# Patient Record
Sex: Female | Born: 1965 | Race: White | Hispanic: No | Marital: Married | Smoking: Former smoker
Health system: Southern US, Community
[De-identification: ages and names within clinical notes are randomized; demographics above are authoritative.]

## PROBLEM LIST (undated history)

## (undated) DIAGNOSIS — T7840XA Allergy, unspecified, initial encounter: Secondary | ICD-10-CM

## (undated) DIAGNOSIS — S92902A Unspecified fracture of left foot, initial encounter for closed fracture: Secondary | ICD-10-CM

## (undated) DIAGNOSIS — K219 Gastro-esophageal reflux disease without esophagitis: Secondary | ICD-10-CM

## (undated) HISTORY — PX: FOOT SURGERY: SHX648

## (undated) HISTORY — PX: ABDOMINAL HYSTERECTOMY: SHX81

## (undated) HISTORY — PX: TUBAL LIGATION: SHX77

## (undated) HISTORY — DX: Allergy, unspecified, initial encounter: T78.40XA

## (undated) HISTORY — PX: ELBOW FRACTURE SURGERY: SHX616

---

## 1998-01-22 ENCOUNTER — Other Ambulatory Visit: Admission: RE | Admit: 1998-01-22 | Discharge: 1998-01-22 | Payer: Self-pay | Admitting: Obstetrics & Gynecology

## 1998-04-28 ENCOUNTER — Ambulatory Visit (HOSPITAL_COMMUNITY): Admission: RE | Admit: 1998-04-28 | Discharge: 1998-04-28 | Payer: Self-pay | Admitting: Obstetrics and Gynecology

## 1998-07-29 ENCOUNTER — Inpatient Hospital Stay (HOSPITAL_COMMUNITY): Admission: AD | Admit: 1998-07-29 | Discharge: 1998-07-31 | Payer: Self-pay | Admitting: Obstetrics & Gynecology

## 1998-09-09 ENCOUNTER — Ambulatory Visit (HOSPITAL_COMMUNITY): Admission: RE | Admit: 1998-09-09 | Discharge: 1998-09-09 | Payer: Self-pay | Admitting: Obstetrics and Gynecology

## 2015-04-01 ENCOUNTER — Encounter (HOSPITAL_BASED_OUTPATIENT_CLINIC_OR_DEPARTMENT_OTHER): Payer: Self-pay | Admitting: *Deleted

## 2015-04-02 ENCOUNTER — Ambulatory Visit (HOSPITAL_BASED_OUTPATIENT_CLINIC_OR_DEPARTMENT_OTHER): Payer: Commercial Managed Care - PPO | Admitting: Anesthesiology

## 2015-04-02 ENCOUNTER — Encounter (HOSPITAL_BASED_OUTPATIENT_CLINIC_OR_DEPARTMENT_OTHER)
Admission: RE | Disposition: A | Discharge status: Home / Self Care | Payer: Self-pay | Source: Ambulatory Visit | Attending: Orthopedic Surgery

## 2015-04-02 ENCOUNTER — Ambulatory Visit (HOSPITAL_BASED_OUTPATIENT_CLINIC_OR_DEPARTMENT_OTHER)
Admission: RE | Admit: 2015-04-02 | Discharge: 2015-04-02 | Disposition: A | Discharge status: Home / Self Care | Payer: Commercial Managed Care - PPO | Source: Ambulatory Visit | Attending: Orthopedic Surgery | Admitting: Orthopedic Surgery

## 2015-04-02 ENCOUNTER — Encounter (HOSPITAL_BASED_OUTPATIENT_CLINIC_OR_DEPARTMENT_OTHER): Payer: Self-pay | Admitting: *Deleted

## 2015-04-02 DIAGNOSIS — Z79899 Other long term (current) drug therapy: Secondary | ICD-10-CM | POA: Diagnosis not present

## 2015-04-02 DIAGNOSIS — Y929 Unspecified place or not applicable: Secondary | ICD-10-CM | POA: Diagnosis not present

## 2015-04-02 DIAGNOSIS — Z87891 Personal history of nicotine dependence: Secondary | ICD-10-CM | POA: Insufficient documentation

## 2015-04-02 DIAGNOSIS — Y999 Unspecified external cause status: Secondary | ICD-10-CM | POA: Insufficient documentation

## 2015-04-02 DIAGNOSIS — Y939 Activity, unspecified: Secondary | ICD-10-CM | POA: Diagnosis not present

## 2015-04-02 DIAGNOSIS — M79672 Pain in left foot: Secondary | ICD-10-CM | POA: Diagnosis present

## 2015-04-02 DIAGNOSIS — Z9889 Other specified postprocedural states: Secondary | ICD-10-CM | POA: Diagnosis not present

## 2015-04-02 DIAGNOSIS — S92352A Displaced fracture of fifth metatarsal bone, left foot, initial encounter for closed fracture: Secondary | ICD-10-CM | POA: Insufficient documentation

## 2015-04-02 DIAGNOSIS — Z90710 Acquired absence of both cervix and uterus: Secondary | ICD-10-CM | POA: Insufficient documentation

## 2015-04-02 DIAGNOSIS — K219 Gastro-esophageal reflux disease without esophagitis: Secondary | ICD-10-CM | POA: Diagnosis not present

## 2015-04-02 DIAGNOSIS — X501XXA Overexertion from prolonged static or awkward postures, initial encounter: Secondary | ICD-10-CM | POA: Insufficient documentation

## 2015-04-02 HISTORY — DX: Unspecified fracture of left foot, initial encounter for closed fracture: S92.902A

## 2015-04-02 HISTORY — DX: Gastro-esophageal reflux disease without esophagitis: K21.9

## 2015-04-02 HISTORY — PX: ORIF TOE FRACTURE: SHX5032

## 2015-04-02 SURGERY — OPEN REDUCTION INTERNAL FIXATION (ORIF) METATARSAL (TOE) FRACTURE
Anesthesia: Regional | Site: Toe | Laterality: Left

## 2015-04-02 MED ORDER — LACTATED RINGERS IV SOLN
INTRAVENOUS | Status: DC
  Administered 2015-04-02 (×2): via INTRAVENOUS

## 2015-04-02 MED ORDER — BUPIVACAINE-EPINEPHRINE (PF) 0.5% -1:200000 IJ SOLN
INTRAMUSCULAR | Status: DC | PRN
  Administered 2015-04-02: 30 mL via PERINEURAL

## 2015-04-02 MED ORDER — GLYCOPYRROLATE 0.2 MG/ML IJ SOLN
0.2000 mg | Freq: Once | INTRAMUSCULAR | Status: DC | PRN
Start: 2015-04-02 — End: 2015-04-02

## 2015-04-02 MED ORDER — MIDAZOLAM HCL 2 MG/2ML IJ SOLN
1.0000 mg | INTRAMUSCULAR | Status: DC | PRN
  Administered 2015-04-02: 2 mg via INTRAVENOUS

## 2015-04-02 MED ORDER — LIDOCAINE HCL (CARDIAC) 20 MG/ML IV SOLN
INTRAVENOUS | Status: AC
  Filled 2015-04-02: qty 5

## 2015-04-02 MED ORDER — HYDROMORPHONE HCL 1 MG/ML IJ SOLN
INTRAMUSCULAR | Status: AC
  Filled 2015-04-02: qty 1

## 2015-04-02 MED ORDER — ONDANSETRON HCL 4 MG/2ML IJ SOLN
INTRAMUSCULAR | Status: DC | PRN
  Administered 2015-04-02: 4 mg via INTRAVENOUS

## 2015-04-02 MED ORDER — FENTANYL CITRATE (PF) 100 MCG/2ML IJ SOLN
INTRAMUSCULAR | Status: AC
  Filled 2015-04-02: qty 2

## 2015-04-02 MED ORDER — HYDROMORPHONE HCL 1 MG/ML IJ SOLN
0.2500 mg | INTRAMUSCULAR | Status: DC | PRN
  Administered 2015-04-02 (×3): 0.5 mg via INTRAVENOUS

## 2015-04-02 MED ORDER — FENTANYL CITRATE (PF) 100 MCG/2ML IJ SOLN
INTRAMUSCULAR | Status: AC
  Filled 2015-04-02: qty 4

## 2015-04-02 MED ORDER — MIDAZOLAM HCL 2 MG/2ML IJ SOLN
INTRAMUSCULAR | Status: AC
  Filled 2015-04-02: qty 2

## 2015-04-02 MED ORDER — PROPOFOL 10 MG/ML IV BOLUS
INTRAVENOUS | Status: DC | PRN
  Administered 2015-04-02: 200 mg via INTRAVENOUS

## 2015-04-02 MED ORDER — CEFAZOLIN SODIUM-DEXTROSE 2-3 GM-% IV SOLR
INTRAVENOUS | Status: AC
  Filled 2015-04-02: qty 50

## 2015-04-02 MED ORDER — VITAMIN D (ERGOCALCIFEROL) 1.25 MG (50000 UNIT) PO CAPS: 50000.0000 [IU] | ORAL_CAPSULE | ORAL | Status: DC

## 2015-04-02 MED ORDER — ONDANSETRON HCL 4 MG/2ML IJ SOLN
INTRAMUSCULAR | Status: AC
  Filled 2015-04-02: qty 2

## 2015-04-02 MED ORDER — DEXAMETHASONE SODIUM PHOSPHATE 10 MG/ML IJ SOLN
INTRAMUSCULAR | Status: DC | PRN
  Administered 2015-04-02: 10 mg via INTRAVENOUS

## 2015-04-02 MED ORDER — OXYCODONE HCL 5 MG PO TABS
ORAL_TABLET | ORAL | Status: AC
  Filled 2015-04-02: qty 1

## 2015-04-02 MED ORDER — OXYCODONE HCL 5 MG/5ML PO SOLN: 5.0000 mg | Freq: Once | ORAL | Status: AC | PRN

## 2015-04-02 MED ORDER — FENTANYL CITRATE (PF) 100 MCG/2ML IJ SOLN
50.0000 ug | INTRAMUSCULAR | Status: AC | PRN
  Administered 2015-04-02: 100 ug via INTRAVENOUS
  Administered 2015-04-02 (×2): 50 ug via INTRAVENOUS

## 2015-04-02 MED ORDER — OXYCODONE HCL 5 MG PO TABS
5.0000 mg | ORAL_TABLET | Freq: Once | ORAL | Status: AC | PRN
  Administered 2015-04-02: 5 mg via ORAL

## 2015-04-02 MED ORDER — CEFAZOLIN SODIUM-DEXTROSE 2-3 GM-% IV SOLR
2.0000 g | INTRAVENOUS | Status: AC
  Administered 2015-04-02: 2 g via INTRAVENOUS

## 2015-04-02 MED ORDER — CHLORHEXIDINE GLUCONATE 4 % EX LIQD: 60.0000 mL | Freq: Once | CUTANEOUS | Status: DC

## 2015-04-02 MED ORDER — MEPERIDINE HCL 25 MG/ML IJ SOLN: 6.2500 mg | INTRAMUSCULAR | Status: DC | PRN

## 2015-04-02 MED ORDER — DEXAMETHASONE SODIUM PHOSPHATE 10 MG/ML IJ SOLN
INTRAMUSCULAR | Status: AC
  Filled 2015-04-02: qty 1

## 2015-04-02 MED ORDER — OXYCODONE HCL 5 MG PO TABS: ORAL_TABLET | ORAL | Status: DC

## 2015-04-02 MED ORDER — LIDOCAINE HCL (CARDIAC) 20 MG/ML IV SOLN
INTRAVENOUS | Status: DC | PRN
  Administered 2015-04-02: 50 mg via INTRAVENOUS

## 2015-04-02 MED ORDER — SCOPOLAMINE 1 MG/3DAYS TD PT72: 1.0000 | MEDICATED_PATCH | Freq: Once | TRANSDERMAL | Status: DC | PRN

## 2015-04-02 MED ORDER — DIAZEPAM 2 MG PO TABS: 2.0000 mg | ORAL_TABLET | Freq: Three times a day (TID) | ORAL | Status: DC | PRN

## 2015-04-02 SURGICAL SUPPLY — 41 items
BANDAGE ELASTIC 4 VELCRO ST LF (GAUZE/BANDAGES/DRESSINGS) ×3 IMPLANT
BANDAGE ELASTIC 6 VELCRO ST LF (GAUZE/BANDAGES/DRESSINGS) ×3 IMPLANT
BLADE SURG 15 STRL LF DISP TIS (BLADE) ×1 IMPLANT
BLADE SURG 15 STRL SS (BLADE) ×2
BNDG COHESIVE 3X5 TAN STRL LF (GAUZE/BANDAGES/DRESSINGS) ×3 IMPLANT
COVER BACK TABLE 60X90IN (DRAPES) ×3 IMPLANT
DRAPE EXTREMITY T 121X128X90 (DRAPE) ×3 IMPLANT
DRAPE OEC MINIVIEW 54X84 (DRAPES) ×3 IMPLANT
DRAPE U 20/CS (DRAPES) ×3 IMPLANT
DRAPE U-SHAPE 47X51 STRL (DRAPES) ×3 IMPLANT
DRILL BIT CANNULATED 3.2 (BIT) ×3 IMPLANT
DURAPREP 26ML APPLICATOR (WOUND CARE) ×3 IMPLANT
ELECT REM PT RETURN 9FT ADLT (ELECTROSURGICAL) ×3
ELECTRODE REM PT RTRN 9FT ADLT (ELECTROSURGICAL) ×1 IMPLANT
GAUZE SPONGE 4X4 12PLY STRL (GAUZE/BANDAGES/DRESSINGS) ×3 IMPLANT
GAUZE XEROFORM 1X8 LF (GAUZE/BANDAGES/DRESSINGS) ×3 IMPLANT
GLOVE BIO SURGEON STRL SZ7 (GLOVE) ×3 IMPLANT
GLOVE BIOGEL PI IND STRL 7.0 (GLOVE) ×3 IMPLANT
GLOVE BIOGEL PI IND STRL 7.5 (GLOVE) ×1 IMPLANT
GLOVE BIOGEL PI INDICATOR 7.0 (GLOVE) ×6
GLOVE BIOGEL PI INDICATOR 7.5 (GLOVE) ×2
GLOVE ECLIPSE 6.5 STRL STRAW (GLOVE) ×3 IMPLANT
GLOVE SS BIOGEL STRL SZ 7.5 (GLOVE) ×1 IMPLANT
GLOVE SUPERSENSE BIOGEL SZ 7.5 (GLOVE) ×2
GOWN STRL REUS W/ TWL LRG LVL3 (GOWN DISPOSABLE) ×3 IMPLANT
GOWN STRL REUS W/TWL LRG LVL3 (GOWN DISPOSABLE) ×6
K-WIRE 2.0 (WIRE) ×2
K-WIRE FX228X2XKRSH (WIRE) ×1
KWIRE FX228X2XKRSH (WIRE) ×1 IMPLANT
NS IRRIG 1000ML POUR BTL (IV SOLUTION) ×3 IMPLANT
PACK BASIN DAY SURGERY FS (CUSTOM PROCEDURE TRAY) ×3 IMPLANT
PADDING CAST ABS 4INX4YD NS (CAST SUPPLIES) ×4
PADDING CAST ABS COTTON 4X4 ST (CAST SUPPLIES) ×2 IMPLANT
PADDING CAST COTTON 6X4 STRL (CAST SUPPLIES) ×6 IMPLANT
SCREW JONES 4.5X45 (Screw) ×3 IMPLANT
SPLINT FAST PLASTER 5X30 (CAST SUPPLIES) ×40
SPLINT PLASTER CAST FAST 5X30 (CAST SUPPLIES) ×20 IMPLANT
SUT ETHILON 3 0 PS 1 (SUTURE) ×3 IMPLANT
SYR BULB 3OZ (MISCELLANEOUS) ×3 IMPLANT
TOWEL OR 17X24 6PK STRL BLUE (TOWEL DISPOSABLE) ×6 IMPLANT
UNDERPAD 30X30 (UNDERPADS AND DIAPERS) ×3 IMPLANT

## 2015-04-02 NOTE — Progress Notes (Signed)
Assisted Dr. Crews with left, ultrasound guided, popliteal block. Side rails up, monitors on throughout procedure. See vital signs in flow sheet. Tolerated Procedure well. 

## 2015-04-02 NOTE — Anesthesia Preprocedure Evaluation (Signed)
Anesthesia Evaluation  Patient identified by MRN, date of birth, ID band Patient awake    Reviewed: Allergy & Precautions, NPO status , Patient's Chart, lab work & pertinent test results  Airway Mallampati: I  TM Distance: >3 FB Neck ROM: Full    Dental  (+) Teeth Intact, Dental Advisory Given   Pulmonary former smoker,    breath sounds clear to auscultation       Cardiovascular  Rhythm:Regular Rate:Normal     Neuro/Psych    GI/Hepatic GERD  Medicated and Controlled,  Endo/Other    Renal/GU      Musculoskeletal   Abdominal   Peds  Hematology   Anesthesia Other Findings   Reproductive/Obstetrics                             Anesthesia Physical Anesthesia Plan  ASA: II  Anesthesia Plan: General and Regional   Post-op Pain Management:    Induction: Intravenous  Airway Management Planned: LMA  Additional Equipment:   Intra-op Plan:   Post-operative Plan: Extubation in OR  Informed Consent: I have reviewed the patients History and Physical, chart, labs and discussed the procedure including the risks, benefits and alternatives for the proposed anesthesia with the patient or authorized representative who has indicated his/her understanding and acceptance.   Dental advisory given  Plan Discussed with: CRNA, Anesthesiologist and Surgeon  Anesthesia Plan Comments:         Anesthesia Quick Evaluation  

## 2015-04-02 NOTE — Anesthesia Procedure Notes (Addendum)
Anesthesia Regional Block:  Popliteal block  Pre-Anesthetic Checklist: ,, timeout performed, Correct Patient, Correct Site, Correct Laterality, Correct Procedure, Correct Position, site marked, Risks and benefits discussed,  Surgical consent,  Pre-op evaluation,  At surgeon's request and post-op pain management  Laterality: Left and Lower  Prep: chloraprep       Needles:  Injection technique: Single-shot  Needle Type: Echogenic Needle     Needle Length: 9cm 9 cm Needle Gauge: 21 and 21 G    Additional Needles:  Procedures: ultrasound guided (picture in chart) Popliteal block Narrative:  Start time: 04/02/2015 10:07 AM End time: 04/02/2015 10:12 AM Injection made incrementally with aspirations every 5 mL.  Performed by: Personally  Anesthesiologist: CREWS, DAVID   Procedure Name: LMA Insertion Date/Time: 04/02/2015 11:07 AM Performed by: Caren MacadamARTER, Jalyn Rosero W Pre-anesthesia Checklist: Patient identified, Emergency Drugs available, Suction available and Patient being monitored Patient Re-evaluated:Patient Re-evaluated prior to inductionOxygen Delivery Method: Circle System Utilized Preoxygenation: Pre-oxygenation with 100% oxygen Intubation Type: IV induction Ventilation: Mask ventilation without difficulty LMA: LMA inserted LMA Size: 4.0 Number of attempts: 1 Airway Equipment and Method: Bite block Placement Confirmation: positive ETCO2 and breath sounds checked- equal and bilateral Tube secured with: Tape Dental Injury: Teeth and Oropharynx as per pre-operative assessment

## 2015-04-02 NOTE — Transfer of Care (Signed)
Immediate Anesthesia Transfer of Care Note  Patient: Jennifer Gilbert  Procedure(s) Performed: Procedure(s): OPEN REDUCTION INTERNAL FIXATION (ORIF) LEFT FOOT FIFTH METATARSAL (TOE) FRACTURE (Left)  Patient Location: PACU  Anesthesia Type:General and GA combined with regional for post-op pain  Level of Consciousness: awake  Airway & Oxygen Therapy: Patient Spontanous Breathing and Patient connected to face mask oxygen  Post-op Assessment: Report given to RN and Post -op Vital signs reviewed and stable  Post vital signs: Reviewed and stable  Last Vitals:  Filed Vitals:   04/02/15 1030  BP:   Pulse: 88  Temp:   Resp: 23    Complications: No apparent anesthesia complications

## 2015-04-02 NOTE — Anesthesia Postprocedure Evaluation (Signed)
  Anesthesia Post-op Note  Patient: Jennifer Gilbert  Procedure(s) Performed: Procedure(s): OPEN REDUCTION INTERNAL FIXATION (ORIF) LEFT FOOT FIFTH METATARSAL (TOE) FRACTURE (Left)  Patient Location: PACU  Anesthesia Type: General, Regional   Level of Consciousness: awake, alert  and oriented  Airway and Oxygen Therapy: Patient Spontanous Breathing  Post-op Pain: mild  Post-op Assessment: Post-op Vital signs reviewed  Post-op Vital Signs: Reviewed  Last Vitals:  Filed Vitals:   04/02/15 1200  BP: 111/67  Pulse: 85  Temp:   Resp: 12    Complications: No apparent anesthesia complications

## 2015-04-02 NOTE — H&P (Signed)
Jennifer Gilbert is an 49 y.o. female.   Chief Complaint: left foot pain HPI: Ms. Jennifer Gilbert is a 49 year-old seen for acute injury to her left foot that occurred with a twisting injury two days ago.  Seen at [facility] where x-rays were consistent with a Jones fracture of her left fifth metatarsal.  She has a history of a Jones fracture on her right foot that required surgery, which was four years ago.  She was placed in a CAM walker when seen at Urgent Care two days ago.    Past Medical History  Diagnosis Date  . GERD (gastroesophageal reflux disease)   . Foot fracture, left     Past Surgical History  Procedure Laterality Date  . Foot surgery Right   . Abdominal hysterectomy    . Cesarean section    . Elbow fracture surgery Left     History reviewed. No pertinent family history. Social History:  reports that she has quit smoking. She does not have any smokeless tobacco history on file. She reports that she drinks alcohol. She reports that she does not use illicit drugs.  Allergies: No Known Allergies  Medications Prior to Admission  Medication Sig Dispense Refill  . omeprazole (PRILOSEC) 20 MG capsule Take 20 mg by mouth daily.      No results found for this or any previous visit (from the past 48 hour(s)). No results found.  Review of Systems  Constitutional: Negative.   HENT: Negative.   Eyes: Negative.   Respiratory: Negative.   Cardiovascular: Negative.   Gastrointestinal: Negative.   Genitourinary: Negative.   Musculoskeletal: Positive for joint pain.       Foot pain  Skin: Negative.   Neurological: Negative.   Endo/Heme/Allergies: Negative.   Psychiatric/Behavioral: Negative.     Blood pressure 117/96, pulse 83, temperature 98.2 F (36.8 C), temperature source Oral, resp. rate 20, height 5\' 9"  (1.753 m), weight 117.482 kg (259 lb), SpO2 100 %. Physical Exam  Constitutional: She is oriented to person, place, and time. She appears well-developed and  well-nourished.  HENT:  Head: Normocephalic and atraumatic.  Mouth/Throat: Oropharynx is clear and moist.  Eyes: Conjunctivae are normal. Pupils are equal, round, and reactive to light.  Neck: Neck supple.  Cardiovascular: Normal rate and regular rhythm.   Respiratory: Effort normal and breath sounds normal.  GI: Bowel sounds are normal.  Genitourinary:  Not pertinent to current symptomatology therefore not examined.  Musculoskeletal:  Examination of her left foot reveals significant swelling and pain.  No deformity.  No wounds.  Examination of her right foot reveals previous well healed lateral incision with swelling, deformity, and  pain.  Vascular exam: Pulses are 2+ and symmetric.   Neurological: She is alert and oriented to person, place, and time.  Skin: Skin is dry.  Psychiatric: She has a normal mood and affect. Her behavior is normal.     Assessment 1. Left foot acute traumatic fifth metatarsal Jones fracture.   2. History of previous right foot ORIF for Jones fracture. Plan At this point I have recommended that we proceed with ORIF of this.  Risks, complications and benefits of the surgery have been described to her in detail and she understands this completely.  We will plan on setting her up for this at some point in the near future.   Jaggar Benko J 04/02/2015, 9:37 AM

## 2015-04-02 NOTE — Discharge Instructions (Signed)
° ° °  Regional Anesthesia Blocks ° °1. Numbness or the inability to move the "blocked" extremity may last from 3-48 hours after placement. The length of time depends on the medication injected and your individual response to the medication. If the numbness is not going away after 48 hours, call your surgeon. ° °2. The extremity that is blocked will need to be protected until the numbness is gone and the  Strength has returned. Because you cannot feel it, you will need to take extra care to avoid injury. Because it may be weak, you may have difficulty moving it or using it. You may not know what position it is in without looking at it while the block is in effect. ° °3. For blocks in the legs and feet, returning to weight bearing and walking needs to be done carefully. You will need to wait until the numbness is entirely gone and the strength has returned. You should be able to move your leg and foot normally before you try and bear weight or walk. You will need someone to be with you when you first try to ensure you do not fall and possibly risk injury. ° °4. Bruising and tenderness at the needle site are common side effects and will resolve in a few days. ° °5. Persistent numbness or new problems with movement should be communicated to the surgeon or the [facility] ([phone])/ [facility] ([phone]). ° ° ° °Post Anesthesia Home Care Instructions ° °Activity: °Get plenty of rest for the remainder of the day. A responsible adult should stay with you for 24 hours following the procedure.  °For the next 24 hours, DO NOT: °-Drive a car °-Operate machinery °-Drink alcoholic beverages °-Take any medication unless instructed by your physician °-Make any legal decisions or sign important papers. ° °Meals: °Start with liquid foods such as gelatin or soup. Progress to regular foods as tolerated. Avoid greasy, spicy, heavy foods. If nausea and/or vomiting occur, drink only clear liquids until  the nausea and/or vomiting subsides. Call your physician if vomiting continues. ° °Special Instructions/Symptoms: °Your throat may feel dry or sore from the anesthesia or the breathing tube placed in your throat during surgery. If this causes discomfort, gargle with warm salt water. The discomfort should disappear within 24 hours. ° °If you had a scopolamine patch placed behind your ear for the management of post- operative nausea and/or vomiting: ° °1. The medication in the patch is effective for 72 hours, after which it should be removed.  Wrap patch in a tissue and discard in the trash. Wash hands thoroughly with soap and water. °2. You may remove the patch earlier than 72 hours if you experience unpleasant side effects which may include dry mouth, dizziness or visual disturbances. °3. Avoid touching the patch. Wash your hands with soap and water after contact with the patch. °  ° °

## 2015-04-03 ENCOUNTER — Encounter (HOSPITAL_BASED_OUTPATIENT_CLINIC_OR_DEPARTMENT_OTHER): Payer: Self-pay | Admitting: Orthopedic Surgery

## 2015-04-04 NOTE — Op Note (Signed)
NAMCristino Martes:  Louischarles, Jonisha               ACCOUNT NO.:  [id number]  MEDICAL RECORD NO.:  [id number]  LOCATION:                                 FACILITY:  PHYSICIAN:  Jadae Steinke A. Thurston HoleWainer, M.D.      DATE OF BIRTH:  DATE OF PROCEDURE:  04/02/2015 DATE OF DISCHARGE:                              OPERATIVE REPORT   PREOPERATIVE DIAGNOSIS:  Left foot acute traumatic fifth metatarsal displaced Jones fracture.  POSTOPERATIVE DIAGNOSIS:  Left foot acute traumatic fifth metatarsal displaced Jones fracture.  PROCEDURE:  Open reduction and internal fixation of left foot fifth metatarsal Jones fracture.  SURGEON:  Elana Almobert A. Thurston HoleWainer, M.D.  ASSISTANT:  Kirstin Shepperson, PA-C.  ANESTHESIA:  General.  OPERATIVE TIME:  45 minutes.  COMPLICATIONS:  None.  INDICATIONS FOR PROCEDURE:  [name] is a 49 year old woman who had sustained a twisting injury to her left foot 2 weeks ago.  Exam and x- rays have revealed a displaced fifth metatarsal Jones fracture and she is now to undergo ORIF of this.  DESCRIPTION OF PROCEDURE:  [name] was brought into the operating room on April 02, 2015, placed on the operating table in supine position.  After being placed under general anesthesia, her left foot and leg were prepped using sterile DuraPrep and draped using sterile technique.  Time-out procedure was called and the correct left foot identified.  The left foot after being sterilely prepped and draped had a small 1 cm incision made proximal to the base of the fifth metatarsal under fluoroscopic control.  A guide pin was placed at the tip of the fifth metatarsal and then drilled down the metatarsal shaft under fluoroscopic control.  After this was done, it was then overdrilled with a 3.2 mm drill and then measured for length and then a 4.5 x 45 mm [facility] fifth metatarsal screw was used and placed across the fracture site with firm fixation in anatomic position.  AP and lateral fluoroscopic  x-rays confirmed this.  Neurovascular structures carefully protected during the procedure.  After this was done, it was felt that all pathology had been satisfactorily addressed.  The wound was irrigated and then closed with interrupted 3-0 nylon suture.  Sterile dressings and a short leg splint applied and then the patient awakened and taken to recovery in stable condition.  Needle, sponge count was correct x2 at the end of the case.  FOLLOWUP CARE:  [name] will be followed as an outpatient on hydrocodone for pain.  She will be seen back in the office in a week for recheck and followup.     Praneel Haisley A. Thurston HoleWainer, M.D.     RAW/MEDQ  D:  04/03/2015  T:  04/03/2015  Job:  [id number]

## 2018-07-20 ENCOUNTER — Ambulatory Visit: Payer: Commercial Managed Care - PPO | Admitting: Gastroenterology

## 2018-07-20 ENCOUNTER — Encounter: Payer: Self-pay | Admitting: Gastroenterology

## 2018-07-20 VITALS — BP 128/70 | HR 68 | Ht 71.0 in | Wt 250.0 lb

## 2018-07-20 DIAGNOSIS — R1310 Dysphagia, unspecified: Secondary | ICD-10-CM

## 2018-07-20 MED ORDER — LANSOPRAZOLE 30 MG PO CPDR: 30.0000 mg | DELAYED_RELEASE_CAPSULE | Freq: Two times a day (BID) | ORAL | 11 refills | Status: AC

## 2018-07-20 NOTE — Progress Notes (Addendum)
Chief Complaint: Dysphagia  Referring Provider:  Dr Abner Greenspan      ASSESSMENT AND PLAN;   #1. GERD with esophageal dysphagia. Differential diagnoses includes esophageal stricture, Schatzki's ring, motility disorder, eosinophilic esophagitis, pill induced esophagitis, rule out esophageal carcinoma or extrinsic lesions.  Plan: -Increased Pravacid to 30mg  po bid. -Ba swallow with barium tablet.  Also obtain lateral films to rule out Zenker's diverticulum. -I have instructed patient that she needs to chew foods especially meats and breads well and eat slowly. -EGD with dil and eso bx thereafter.  I discussed risks and benefits.  Addendum-colonoscopy report 05/2017-hyperplastic colonic polyp status post polypectomy, small internal hemorrhoids.  Repeat in 10 years.  Earlier, if with any new problems or change in family history. HPI:    Jennifer Gilbert is a 53 y.o. female  With dysphagia to solids over last 1 to 2 years, getting worse over the last 1 to 2 months. Mainly in the mid chest Mostly to solids, lately has been having problems with liquids as well. Had heartburn, better with Prevacid Did not tolerate protonix due to abdominal pain Lost 30lb intentionally ever since her husband was diagnosed as having diabetes.  Denies having any melena, hematochezia, nausea/vomiting, history of food impaction requiring endoscopic disimpaction.    Had colonoscopy-[name] to get the report.  Somehow not in [place].  Past Medical History:  Diagnosis Date  . Foot fracture, left   . GERD (gastroesophageal reflux disease)     Past Surgical History:  Procedure Laterality Date  . ABDOMINAL HYSTERECTOMY    . CESAREAN SECTION    . ELBOW FRACTURE SURGERY Left   . FOOT SURGERY Right   . ORIF TOE FRACTURE Left 04/02/2015   Procedure: OPEN REDUCTION INTERNAL FIXATION (ORIF) LEFT FOOT FIFTH METATARSAL (TOE) FRACTURE;  Surgeon: Salvatore Marvel, MD;  Location: [facility];  Service:  Orthopedics;  Laterality: Left;    History reviewed. No pertinent family history.  Social History   Tobacco Use  . Smoking status: Former Games developer  . Smokeless tobacco: Never Used  Substance Use Topics  . Alcohol use: Yes    Comment: social  . Drug use: No    Current Outpatient Medications  Medication Sig Dispense Refill  . ibuprofen (ADVIL,MOTRIN) 200 MG tablet Take 1,000 mg by mouth daily.    . lansoprazole (PREVACID) 30 MG capsule Take 15 mg by mouth 2 (two) times daily.     No current facility-administered medications for this visit.     No Known Allergies  Review of Systems:  Constitutional: Denies fever, chills, diaphoresis, appetite change and fatigue.  HEENT: Denies photophobia, eye pain, redness, hearing loss, ear pain, congestion, sore throat, rhinorrhea, sneezing, mouth sores, neck pain, neck stiffness and tinnitus.   Respiratory: Denies SOB, DOE, cough, chest tightness,  and wheezing.   Cardiovascular: Denies chest pain, palpitations and leg swelling.  Genitourinary: Denies dysuria, urgency, frequency, hematuria, flank pain and difficulty urinating.  Musculoskeletal: Denies myalgias, back pain, joint swelling, arthralgias and gait problem.  Skin: No rash.  Neurological: Denies dizziness, seizures, syncope, weakness, light-headedness, numbness and headaches.  Hematological: Denies adenopathy. Easy bruising, personal or family bleeding history  Psychiatric/Behavioral: No anxiety or depression     Physical Exam:    BP 128/70   Pulse 68   Ht 5\' 11"  (1.803 m)   Wt 250 lb (113.4 kg)   BMI 34.87 kg/m  Filed Weights   07/20/18 1404  Weight: 250 lb (113.4 kg)   Constitutional:  Well-developed, in no acute distress. Psychiatric: Normal mood and affect. Behavior is normal. HEENT: Pupils normal.  Conjunctivae are normal. No scleral icterus. Neck supple.  Cardiovascular: Normal rate, regular rhythm. No edema Pulmonary/chest: Effort normal and breath sounds  normal. No wheezing, rales or rhonchi. Abdominal: Soft, nondistended. Nontender. Bowel sounds active throughout. There are no masses palpable. No hepatomegaly. Rectal:  defered Neurological: Alert and oriented to person place and time. Skin: Skin is warm and dry. No rashes noted.    Edman Circle, MD 07/20/2018, 2:08 PM  Cc: Dr Abner Greenspan.

## 2018-07-20 NOTE — Patient Instructions (Signed)
If you are age 54 or older, your body mass index should be between 23-30. Your Body mass index is 34.87 kg/m. If this is out of the aforementioned range listed, please consider follow up with your Primary Care Provider.  If you are age 2 or younger, your body mass index should be between 19-25. Your Body mass index is 34.87 kg/m. If this is out of the aformentioned range listed, please consider follow up with your Primary Care Provider.   You have been scheduled for a Barium Esophogram at [facility] (1st floor of the hospital) on 07/25/18 at 11am. Please arrive 15 minutes prior to your appointment for registration. Make certain not to have anything to eat or drink 3 hours prior to your test. If you need to reschedule for any reason, please contact radiology at [phone] to do so. __________________________________________________________________ A barium swallow is an examination that concentrates on views of the esophagus. This tends to be a double contrast exam (barium and two liquids which, when combined, create a gas to distend the wall of the oesophagus) or single contrast (non-ionic iodine based). The study is usually tailored to your symptoms so a good history is essential. Attention is paid during the study to the form, structure and configuration of the esophagus, looking for functional disorders (such as aspiration, dysphagia, achalasia, motility and reflux) EXAMINATION You may be asked to change into a gown, depending on the type of swallow being performed. A radiologist and radiographer will perform the procedure. The radiologist will advise you of the type of contrast selected for your procedure and direct you during the exam. You will be asked to stand, sit or lie in several different positions and to hold a small amount of fluid in your mouth before being asked to swallow while the imaging is performed .In some instances you may be asked to swallow barium coated marshmallows  to assess the motility of a solid food bolus. The exam can be recorded as a digital or video fluoroscopy procedure. POST PROCEDURE It will take 1-2 days for the barium to pass through your system. To facilitate this, it is important, unless otherwise directed, to increase your fluids for the next 24-48hrs and to resume your normal diet.  This test typically takes about 30 minutes to perform. __________________________________________________________________________________  We have sent the following medications to your pharmacy for you to pick up at your convenience: Prevacid 30 mg   Thank you,  Dr. Lynann Bologna

## 2018-07-25 ENCOUNTER — Ambulatory Visit (HOSPITAL_COMMUNITY)
Admission: RE | Admit: 2018-07-25 | Discharge: 2018-07-25 | Disposition: A | Discharge status: Home / Self Care | Payer: Commercial Managed Care - PPO | Source: Ambulatory Visit | Attending: Gastroenterology | Admitting: Gastroenterology

## 2018-07-25 DIAGNOSIS — R1310 Dysphagia, unspecified: Secondary | ICD-10-CM | POA: Insufficient documentation

## 2018-07-26 ENCOUNTER — Telehealth: Payer: Self-pay | Admitting: Gastroenterology

## 2018-07-26 NOTE — Telephone Encounter (Signed)
Pt return your call.

## 2018-08-03 ENCOUNTER — Encounter: Payer: Self-pay | Admitting: Gastroenterology

## 2018-08-16 ENCOUNTER — Telehealth: Payer: Self-pay

## 2018-08-16 NOTE — Telephone Encounter (Signed)
Covid-19 travel screening questions  Have you traveled in the last 14 days? If yes where?  Do you now or have you had a fever in the last 14 days?  Do you have any respiratory symptoms of shortness of breath or cough now or in the last 14 days?  Do you have a medical history of Congestive Heart Failure?  Do you have a medical history of lung disease?  Do you have any family members or close contacts with diagnosed or suspected Covid-19?      Left message at 12:01 for patient to call back regarding the screening survey.

## 2018-08-17 ENCOUNTER — Encounter: Payer: Self-pay | Admitting: Gastroenterology

## 2018-08-17 ENCOUNTER — Ambulatory Visit (AMBULATORY_SURGERY_CENTER): Payer: Commercial Managed Care - PPO | Admitting: Gastroenterology

## 2018-08-17 ENCOUNTER — Other Ambulatory Visit: Payer: Self-pay

## 2018-08-17 VITALS — BP 106/64 | HR 69 | Temp 98.4°F | Resp 14 | Ht 71.0 in | Wt 250.0 lb

## 2018-08-17 DIAGNOSIS — K449 Diaphragmatic hernia without obstruction or gangrene: Secondary | ICD-10-CM

## 2018-08-17 DIAGNOSIS — K2970 Gastritis, unspecified, without bleeding: Secondary | ICD-10-CM

## 2018-08-17 DIAGNOSIS — K222 Esophageal obstruction: Secondary | ICD-10-CM | POA: Diagnosis not present

## 2018-08-17 DIAGNOSIS — R1310 Dysphagia, unspecified: Secondary | ICD-10-CM

## 2018-08-17 MED ORDER — SODIUM CHLORIDE 0.9 % IV SOLN: 500.0000 mL | Freq: Once | INTRAVENOUS | Status: AC

## 2018-08-17 NOTE — Op Note (Signed)
[facility] Patient Name: Jennifer Gilbert Procedure Date: 08/17/2018 9:07 AM MRN: [id number] Endoscopist: Lynann Bologna , MD Age: 53 Referring MD:  Date of Birth: 03/30/1966 Gender: Female Account #: [id number] Procedure:                Upper GI endoscopy Indications:              Dysphagia, negative barium swallow Medicines:                Monitored Anesthesia Care Procedure:                Pre-Anesthesia Assessment:                           - Prior to the procedure, a History and Physical                            was performed, and patient medications and                            allergies were reviewed. The patient's tolerance of                            previous anesthesia was also reviewed. The risks                            and benefits of the procedure and the sedation                            options and risks were discussed with the patient.                            All questions were answered, and informed consent                            was obtained. Prior Anticoagulants: The patient has                            taken no previous anticoagulant or antiplatelet                            agents. ASA Grade Assessment: II - A patient with                            mild systemic disease. After reviewing the risks                            and benefits, the patient was deemed in                            satisfactory condition to undergo the procedure.                           After obtaining informed consent, the endoscope was  passed under direct vision. Throughout the                            procedure, the patient's blood pressure, pulse, and                            oxygen saturations were monitored continuously. The                            Endoscope was introduced through the mouth, and                            advanced to the second part of duodenum. The upper                            GI endoscopy was  accomplished without difficulty.                            The patient tolerated the procedure well. Scope In: Scope Out: Findings:                 A non-obstructing and mild Schatzki ring was found                            at the gastroesophageal junction, 36 cm from the                            incisors. Biopsies were obtained from the proximal                            and distal esophagus with cold forceps for                            histology to r/o eosinophilic esophagitis. The                            scope was withdrawn. Dilation was performed with a                            Maloney dilator with no resistance at 50 Fr.                            Estimated blood loss: none.                           A small transient hiatal hernia was present.                           Mild inflammation characterized by erythema was                            found in the gastric antrum. Biopsies were taken  with a cold forceps for histology. Estimated blood                            loss: none.                           The examined duodenum was normal. Estimated blood                            loss: none. Complications:            No immediate complications. Estimated Blood Loss:     Estimated blood loss: none. Impression:               - Schatzki ring s/p esophageal dilatation and                            biopsies.                           - Small transient hiatal hernia.                           - Minimal gastritis. Recommendation:           - Patient has a contact number available for                            emergencies. The signs and symptoms of potential                            delayed complications were discussed with the                            patient. Return to normal activities tomorrow.                            Written discharge instructions were provided to the                            patient.                           -  Post dilatation diet.                           - Continue Prevacid 30 mg p.o. twice daily. If                            still with problems, will switch her to Protonix 40                            mg p.o. twice daily.                           - Await pathology results.                           -  Return to GI clinic PRN. Lynann Bologna, MD 08/17/2018 9:36:12 AM This report has been signed electronically.

## 2018-08-17 NOTE — Progress Notes (Signed)
Called to room to assist during endoscopic procedure.  Patient ID and intended procedure confirmed with present staff. Received instructions for my participation in the procedure from the performing physician.  

## 2018-08-17 NOTE — Progress Notes (Signed)
Report to PACU, RN, vss, BBS= Clear.  

## 2018-08-17 NOTE — Telephone Encounter (Signed)
Covid-19 travel screening questions  Have you traveled in the last 14 days? No If yes where?  Do you now or have you had a fever in the last 14 days? No Do you have any respiratory symptoms of shortness of breath or cough now or in the last 14 days? No Do you have a medical history of Congestive Heart Failure?  Do you have a medical history of lung disease?  Do you have any family members or close contacts with diagnosed or suspected Covid-19? No      

## 2018-08-17 NOTE — Patient Instructions (Signed)
YOU HAD AN ENDOSCOPIC PROCEDURE TODAY AT THE [facility]:   Refer to the procedure report that was given to you for any specific questions about what was found during the examination.  If the procedure report does not answer your questions, please call your gastroenterologist to clarify.  If you requested that your care partner not be given the details of your procedure findings, then the procedure report has been included in a sealed envelope for you to review at your convenience later.  YOU SHOULD EXPECT: Some feelings of bloating in the abdomen. Passage of more gas than usual.  Walking can help get rid of the air that was put into your GI tract during the procedure and reduce the bloating.  Please Note:  You might notice some irritation and congestion in your nose or some drainage.  This is from the oxygen used during your procedure.  There is no need for concern and it should clear up in a day or so.  SYMPTOMS TO REPORT IMMEDIATELY:    Following upper endoscopy (EGD)  Vomiting of blood or coffee ground material  New chest pain or pain under the shoulder blades  Painful or persistently difficult swallowing  New shortness of breath  Fever of 100F or higher  Black, tarry-looking stools  For urgent or emergent issues, a gastroenterologist can be reached at any hour by calling [phone].   DIET:  FOLLOW DILATION DIET- SEE HANDOUT!  Drink plenty of fluids but you should avoid alcoholic beverages for 24 hours.  ACTIVITY:  You should plan to take it easy for the rest of today and you should NOT DRIVE or use heavy machinery until tomorrow (because of the sedation medicines used during the test).    FOLLOW UP: Our staff will call the number listed on your records the next business day following your procedure to check on you and address any questions or concerns that you may have regarding the information given to you following your procedure. If we do not reach you, we will  leave a message.  However, if you are feeling well and you are not experiencing any problems, there is no need to return our call.  We will assume that you have returned to your regular daily activities without incident.  If any biopsies were taken you will be contacted by phone or by letter within the next 1-3 weeks.  Please call us at [phone] if you have not heard about the biopsies in 3 weeks.   SIGNATURES/CONFIDENTIALITY: You and/or your care partner have signed paperwork which will be entered into your electronic medical record.  These signatures attest to the fact that that the information above on your After Visit Summary has been reviewed and is understood.  Full responsibility of the confidentiality of this discharge information lies with you and/or your care-partner.  Await pathology  Please follow dilation diet today  Please read over handouts about hiatal hernias and gastritis  Continue Prevacid 30 mg twice daily- let Dr. Chales Abrahams know if you continue to have problems  Return to Dr. Chales Abrahams as needed

## 2018-08-18 ENCOUNTER — Telehealth: Payer: Self-pay | Admitting: *Deleted

## 2018-08-18 NOTE — Telephone Encounter (Signed)
  Follow up Call-  Call back number 08/17/2018  Post procedure Call Back phone  # [phone]  Permission to leave phone message Yes  Some recent data might be hidden     Patient questions:  Do you have a fever, pain , or abdominal swelling? No. Pain Score  0 *  Have you tolerated food without any problems? Yes.    Have you been able to return to your normal activities? Yes.    Do you have any questions about your discharge instructions: Diet   No. Medications  No. Follow up visit  No.  Do you have questions or concerns about your Car ? No.  Actions: * If pain score is 4 or above: No action needed, pain <4.

## 2018-08-29 ENCOUNTER — Encounter: Payer: Self-pay | Admitting: Gastroenterology

## 2019-10-13 IMAGING — RF DG ESOPHAGUS
11 of 15 series · 14 of 21 positions shown · non-contrast
Comparison: None.

CLINICAL DATA: Dysphagia and gastroesophageal reflux disease.

EXAM:
ESOPHOGRAM / BARIUM SWALLOW / BARIUM TABLET STUDY
TECHNIQUE: Combined double contrast and single contrast examination performed
using effervescent crystals, thick barium liquid, and thin barium
liquid. The patient was observed with fluoroscopy swallowing a 13 mm
barium sulphate tablet.
FLUOROSCOPY TIME:  Fluoroscopy Time:  1.4 minutes
Radiation Exposure Index (if provided by the fluoroscopic device):
26.3 mGy
Number of Acquired Spot Images: 0

[Series 1: cp_standard · 0.26mm/px · 1 of 1 slices shown (1 of 9)]
[im 1/1]
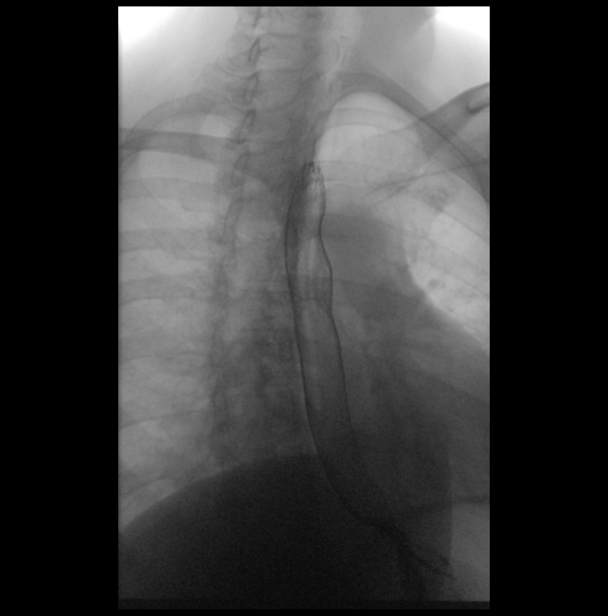

[Series 3: cp_standard · 0.17mm/px · 1 of 1 slices shown (2 of 9)]
[im 1/1]
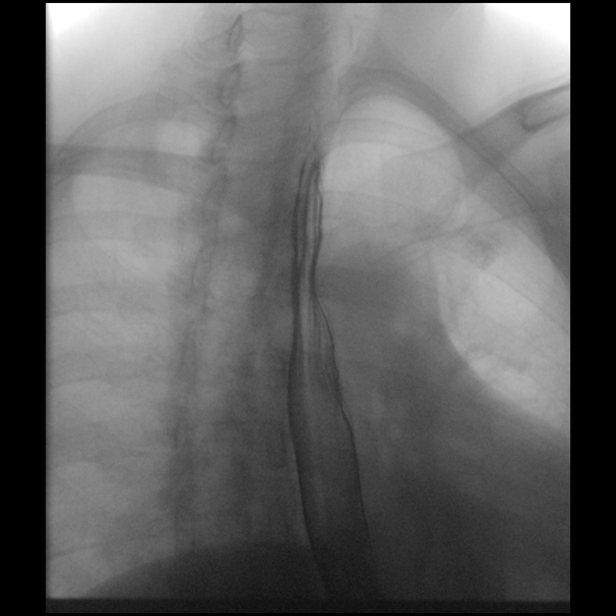

[Series 4: cp_standard · 0.17mm/px · 1 of 1 slices shown (3 of 9)]
[im 1/1]
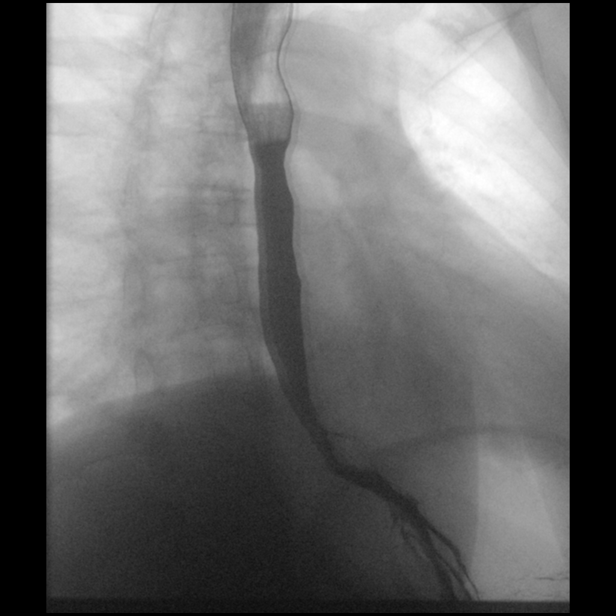

[Series 6: cp_standard · 0.18mm/px · 1 of 1 slices shown (4 of 9)]
[im 1/1]
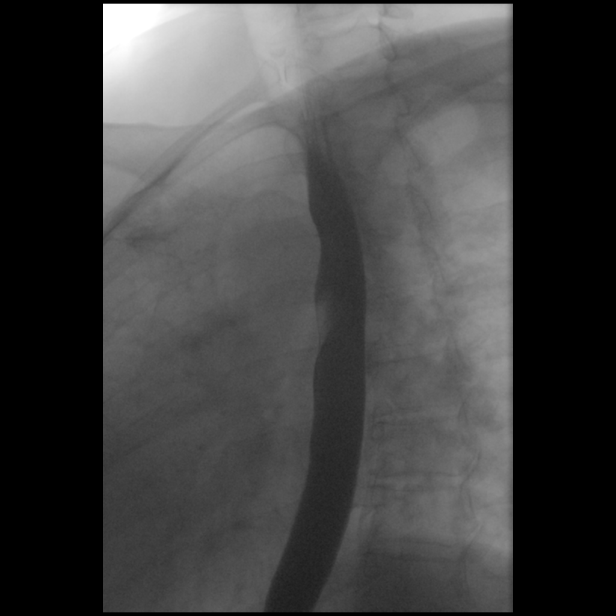

[Series 7: cp_standard · 0.18mm/px · 1 of 1 slices shown (5 of 9)]
[im 1/1]
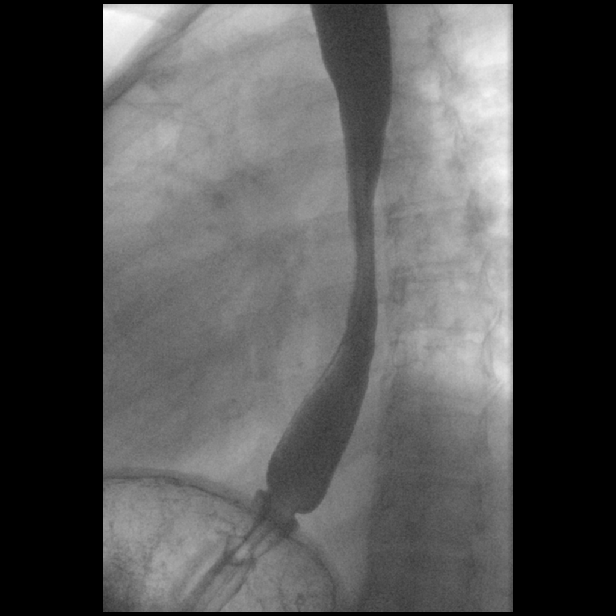

[Series 9: cp_standard · 0.18mm/px · 1 of 1 slices shown (6 of 9)]
[im 1/1]
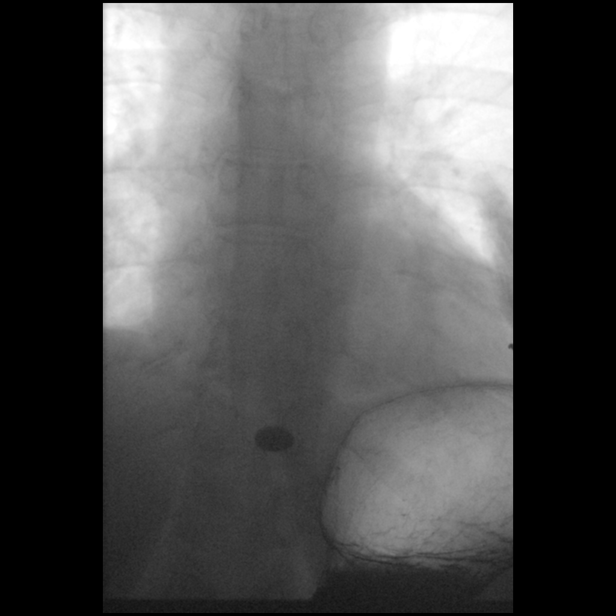

[Series 10: cp_standard · 0.18mm/px · 1 of 1 slices shown (7 of 9)]
[im 1/1]
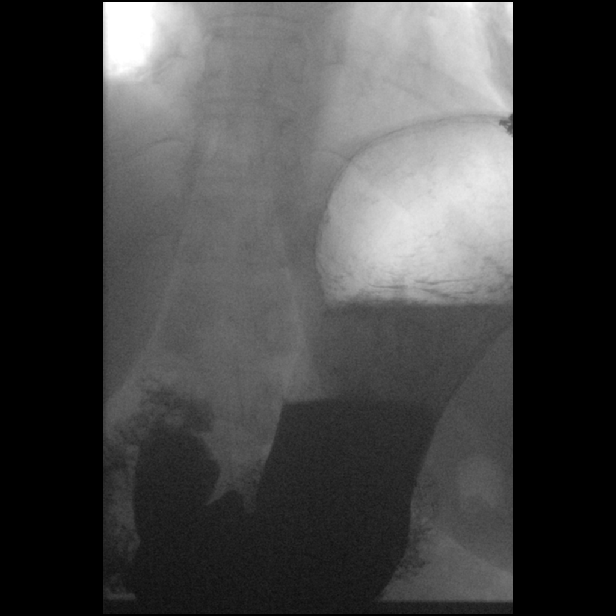

[Series 11: fluoro_barium 2fps_bw · 0.17mm/px · 2 of 4 frames shown (1 of 2)]
[frame 2/4]
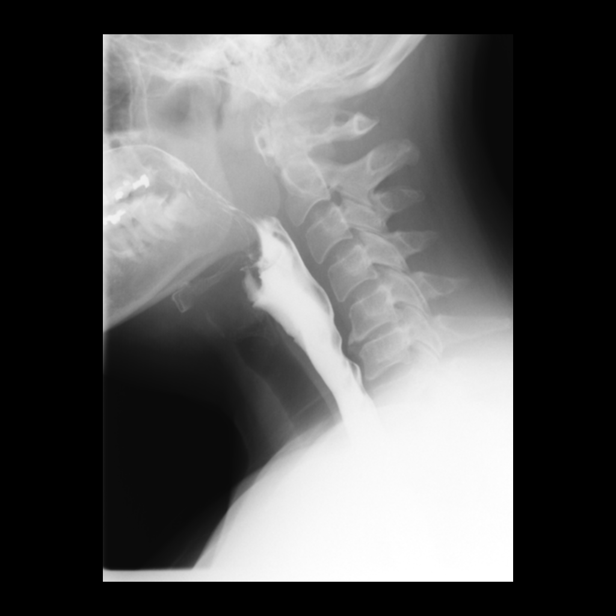
[frame 3/4]
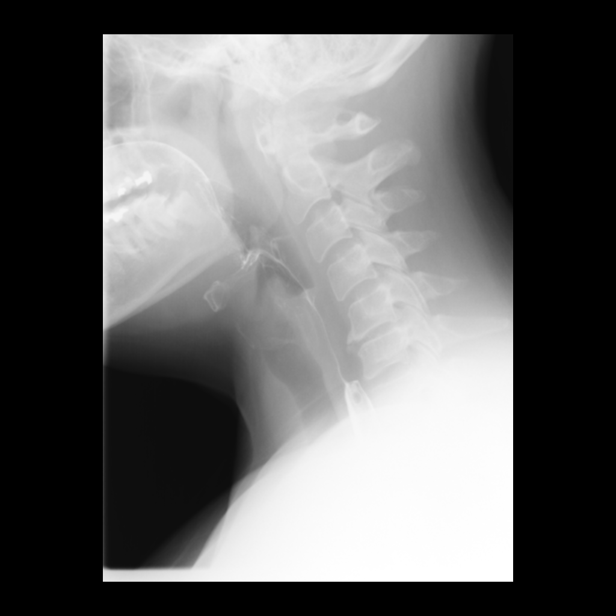

[Series 12: fluoro_barium 2fps_bw · 0.18mm/px · 3 of 6 frames shown (2 of 2)]
[frame 1/6]
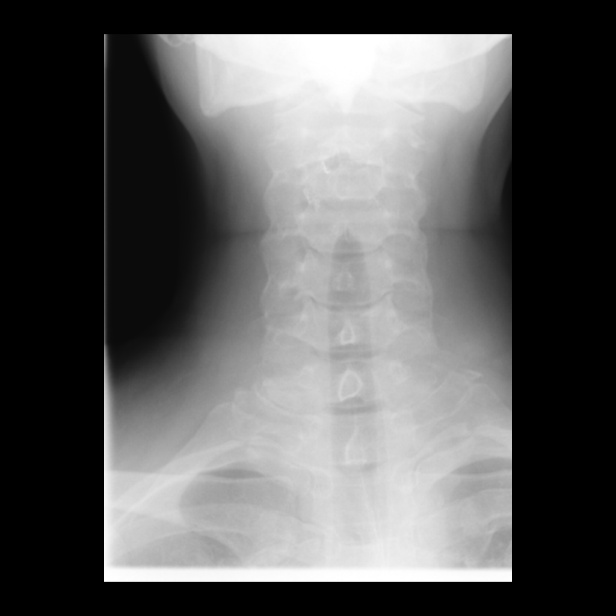
[frame 2/6]
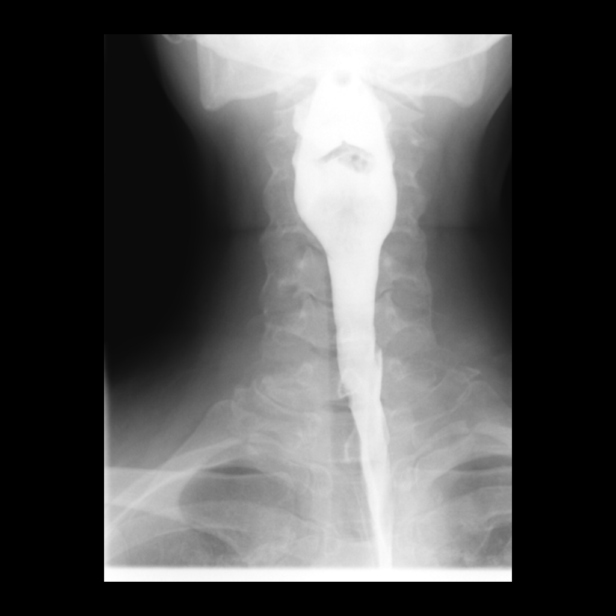
[frame 6/6]
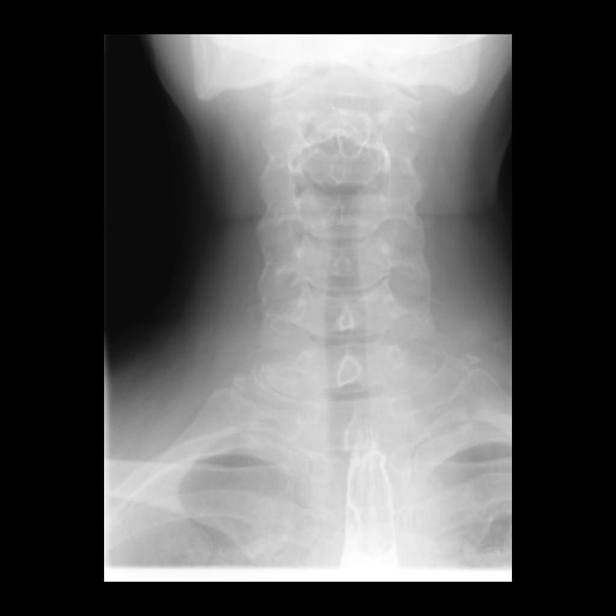

[Series 13: cp_standard · 0.18mm/px · 1 of 1 slices shown (8 of 9)]
[im 1/1]
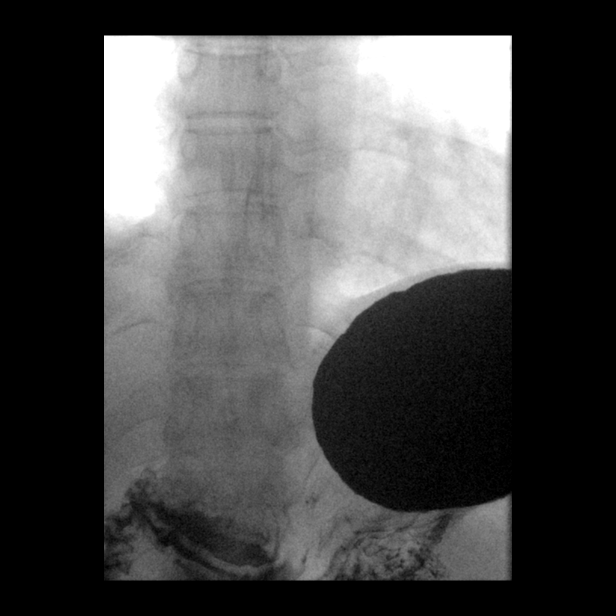

[Series 15: cp_standard · 0.18mm/px · 1 of 1 slices shown (9 of 9)]
[im 1/1]
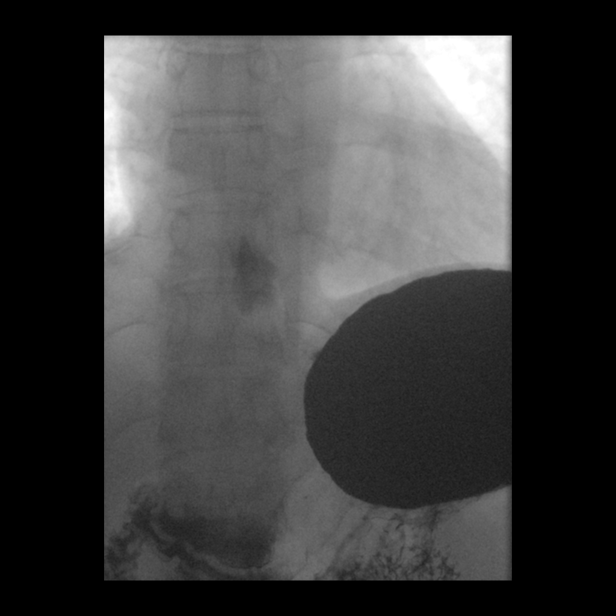

[14 of 21 positions shown; findings below may reference images not displayed]

FINDINGS: The esophagus is patent. No stricture or mass identified. The
motility of the esophagus is unremarkable. A 13 mm barium tablet was
ingested which easily passed through the esophagus and into the
stomach. No significant hiatal hernia identified. No significant
reflux identified. The swallowing mechanism appears unremarkable.
IMPRESSION: 1. Normal exam.

## 2020-05-31 DEATH — deceased
